# Patient Record
Sex: Female | Born: 1960 | Race: Black or African American | Hispanic: No | Smoking: Never smoker
Health system: Southern US, Community
[De-identification: ages and names within clinical notes are randomized; demographics above are authoritative.]

---

## 2002-01-30 ENCOUNTER — Emergency Department (HOSPITAL_COMMUNITY): Admission: EM | Admit: 2002-01-30 | Discharge: 2002-01-30 | Payer: Self-pay | Admitting: Emergency Medicine

## 2004-04-28 ENCOUNTER — Emergency Department (HOSPITAL_COMMUNITY): Admission: EM | Admit: 2004-04-28 | Discharge: 2004-04-29 | Payer: Self-pay | Admitting: Emergency Medicine

## 2005-01-10 ENCOUNTER — Ambulatory Visit (HOSPITAL_COMMUNITY): Admission: RE | Admit: 2005-01-10 | Discharge: 2005-01-10 | Payer: Self-pay | Admitting: Internal Medicine

## 2005-02-03 ENCOUNTER — Observation Stay (HOSPITAL_COMMUNITY): Admission: AD | Admit: 2005-02-03 | Discharge: 2005-02-04 | Payer: Self-pay | Admitting: Neurosurgery

## 2006-03-22 ENCOUNTER — Emergency Department (HOSPITAL_COMMUNITY): Admission: EM | Admit: 2006-03-22 | Discharge: 2006-03-23 | Payer: Self-pay | Admitting: Emergency Medicine

## 2007-07-17 ENCOUNTER — Ambulatory Visit (HOSPITAL_COMMUNITY): Admission: RE | Admit: 2007-07-17 | Discharge: 2007-07-17 | Payer: Self-pay | Admitting: Obstetrics

## 2008-04-22 ENCOUNTER — Emergency Department (HOSPITAL_COMMUNITY): Admission: EM | Admit: 2008-04-22 | Discharge: 2008-04-22 | Payer: Self-pay | Admitting: Emergency Medicine

## 2008-08-07 ENCOUNTER — Ambulatory Visit (HOSPITAL_COMMUNITY): Admission: RE | Admit: 2008-08-07 | Discharge: 2008-08-07 | Payer: Self-pay | Admitting: Internal Medicine

## 2009-01-14 ENCOUNTER — Emergency Department (HOSPITAL_COMMUNITY): Admission: EM | Admit: 2009-01-14 | Discharge: 2009-01-14 | Payer: Self-pay | Admitting: Emergency Medicine

## 2009-05-17 ENCOUNTER — Emergency Department (HOSPITAL_COMMUNITY): Admission: EM | Admit: 2009-05-17 | Discharge: 2009-05-17 | Payer: Self-pay | Admitting: Emergency Medicine

## 2009-05-26 ENCOUNTER — Ambulatory Visit: Payer: Self-pay | Admitting: Internal Medicine

## 2009-05-26 DIAGNOSIS — I10 Essential (primary) hypertension: Secondary | ICD-10-CM | POA: Insufficient documentation

## 2009-05-26 DIAGNOSIS — K921 Melena: Secondary | ICD-10-CM | POA: Insufficient documentation

## 2009-05-26 DIAGNOSIS — E785 Hyperlipidemia, unspecified: Secondary | ICD-10-CM

## 2009-08-10 ENCOUNTER — Ambulatory Visit (HOSPITAL_COMMUNITY): Admission: RE | Admit: 2009-08-10 | Discharge: 2009-08-10 | Payer: Self-pay | Admitting: Obstetrics

## 2009-08-19 ENCOUNTER — Encounter: Admission: RE | Admit: 2009-08-19 | Discharge: 2009-08-19 | Payer: Self-pay | Admitting: Obstetrics

## 2009-12-01 ENCOUNTER — Emergency Department (HOSPITAL_COMMUNITY): Admission: EM | Admit: 2009-12-01 | Discharge: 2009-12-01 | Payer: Self-pay | Admitting: Emergency Medicine

## 2009-12-21 ENCOUNTER — Emergency Department (HOSPITAL_COMMUNITY): Admission: EM | Admit: 2009-12-21 | Discharge: 2009-12-21 | Payer: Self-pay | Admitting: Emergency Medicine

## 2010-03-24 ENCOUNTER — Emergency Department (HOSPITAL_COMMUNITY): Admission: EM | Admit: 2010-03-24 | Discharge: 2010-03-24 | Payer: Self-pay | Admitting: Emergency Medicine

## 2010-09-17 ENCOUNTER — Encounter: Admission: RE | Admit: 2010-09-17 | Discharge: 2010-09-17 | Payer: Self-pay | Admitting: Family Medicine

## 2010-12-05 LAB — CONVERTED CEMR LAB
Albumin: 3.9 g/dL (ref 3.5–5.2)
Basophils Relative: 0.9 % (ref 0.0–3.0)
CO2: 31 meq/L (ref 19–32)
Chloride: 108 meq/L (ref 96–112)
Eosinophils Absolute: 0.2 10*3/uL (ref 0.0–0.7)
HCT: 38.2 % (ref 36.0–46.0)
HDL: 40.4 mg/dL (ref >39.00)
Hemoglobin: 12.7 g/dL (ref 12.0–15.0)
Ketones, ur: NEGATIVE mg/dL
Leukocytes, UA: NEGATIVE
MCHC: 33.1 g/dL (ref 30.0–36.0)
MCV: 78.4 fL (ref 78.0–100.0)
Monocytes Absolute: 0.3 10*3/uL (ref 0.1–1.0)
Neutro Abs: 2.8 10*3/uL (ref 1.4–7.7)
Pap Smear: NORMAL
RBC: 4.87 M/uL (ref 3.87–5.11)
Sodium: 143 meq/L (ref 135–145)
Specific Gravity, Urine: 1.025 (ref 1.000–1.030)
TSH: 0.74 microintl units/mL (ref 0.35–5.50)
Total Protein: 7.1 g/dL (ref 6.0–8.3)
Triglycerides: 86 mg/dL (ref 0.0–149.0)
pH: 6.5 (ref 5.0–8.0)

## 2010-12-08 ENCOUNTER — Emergency Department (HOSPITAL_COMMUNITY)
Admission: EM | Admit: 2010-12-08 | Discharge: 2010-12-08 | Disposition: A | Discharge status: Other Acute Inpatient Hospital | Payer: 59 | Attending: Emergency Medicine | Admitting: Emergency Medicine

## 2010-12-08 DIAGNOSIS — F329 Major depressive disorder, single episode, unspecified: Secondary | ICD-10-CM | POA: Insufficient documentation

## 2010-12-08 DIAGNOSIS — F3289 Other specified depressive episodes: Secondary | ICD-10-CM | POA: Insufficient documentation

## 2010-12-08 DIAGNOSIS — R45851 Suicidal ideations: Secondary | ICD-10-CM | POA: Insufficient documentation

## 2010-12-08 LAB — DIFFERENTIAL
Basophils Absolute: 0 10*3/uL (ref 0.0–0.1)
Lymphocytes Relative: 37 % (ref 12–46)
Monocytes Absolute: 0.4 10*3/uL (ref 0.1–1.0)
Neutro Abs: 3.1 10*3/uL (ref 1.7–7.7)
Neutrophils Relative %: 52 % (ref 43–77)

## 2010-12-08 LAB — COMPREHENSIVE METABOLIC PANEL
Albumin: 4 g/dL (ref 3.5–5.2)
BUN: 9 mg/dL (ref 6–23)
Calcium: 9.3 mg/dL (ref 8.4–10.5)
Creatinine, Ser: 0.82 mg/dL (ref 0.4–1.2)
Glucose, Bld: 93 mg/dL (ref 70–99)
Total Protein: 7.6 g/dL (ref 6.0–8.3)

## 2010-12-08 LAB — CBC
HCT: 40.6 % (ref 36.0–46.0)
Hemoglobin: 13.4 g/dL (ref 12.0–15.0)
MCHC: 33 g/dL (ref 30.0–36.0)
RBC: 5.14 MIL/uL — ABNORMAL HIGH (ref 3.87–5.11)
WBC: 5.9 10*3/uL (ref 4.0–10.5)

## 2010-12-08 LAB — RAPID URINE DRUG SCREEN, HOSP PERFORMED
Amphetamines: NOT DETECTED
Benzodiazepines: NOT DETECTED
Cocaine: NOT DETECTED
Opiates: NOT DETECTED
Tetrahydrocannabinol: POSITIVE — AB

## 2010-12-08 LAB — ETHANOL: Alcohol, Ethyl (B): <5 mg/dL (ref 0–10)

## 2010-12-15 IMAGING — CR DG LUMBAR SPINE COMPLETE 4+V
5 series · 5 of 5 positions shown · non-contrast
Comparison: MRI lumbar spine 01/10/2005.  Intraoperative
radiographs 02/03/2005.

CLINICAL DATA: 48-year-old female with low back pain for 1 week.
History of prior back surgery.

LUMBAR SPINE - COMPLETE 4+ VIEW

[t l-spine a.p.]
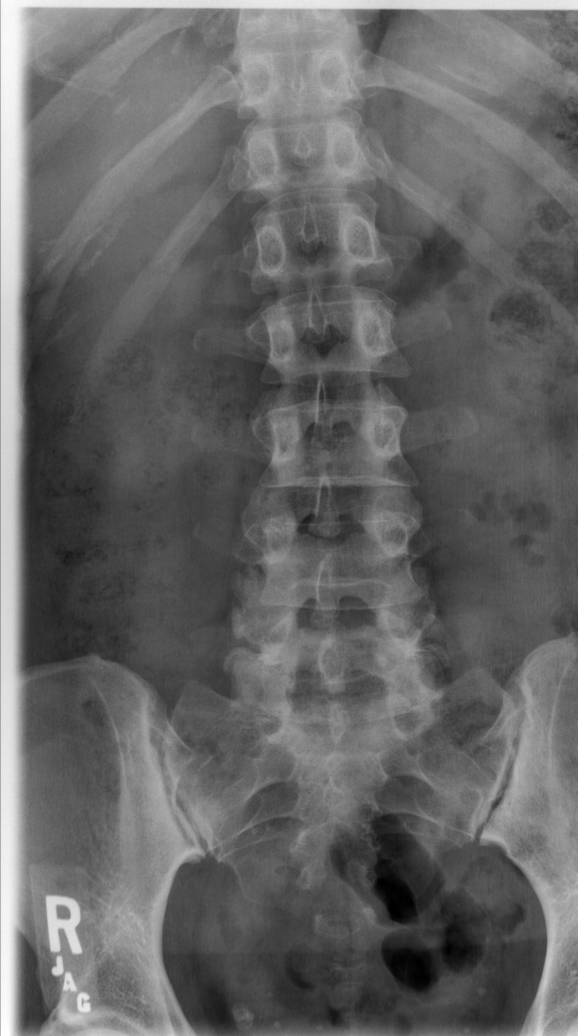

[t l-spine oblique exposure (1 of 2)]
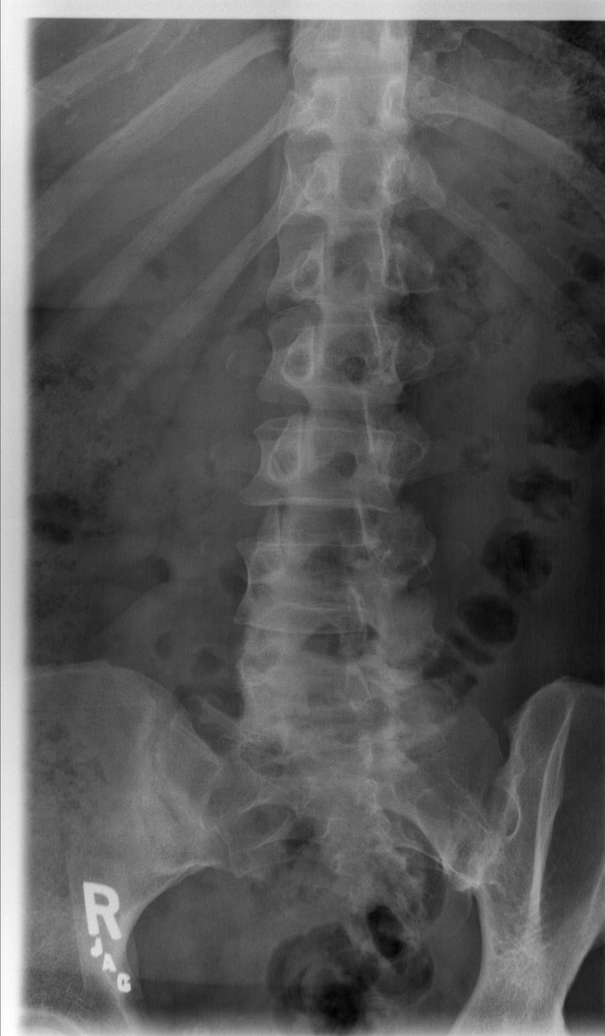

[t l-spine oblique exposure (2 of 2)]
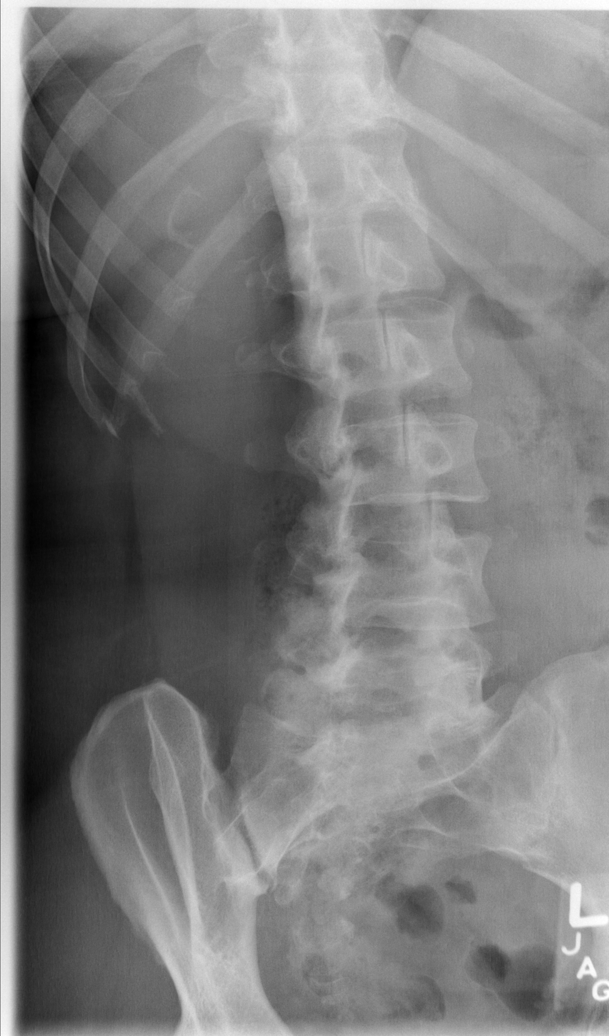

[t l-spine lat]
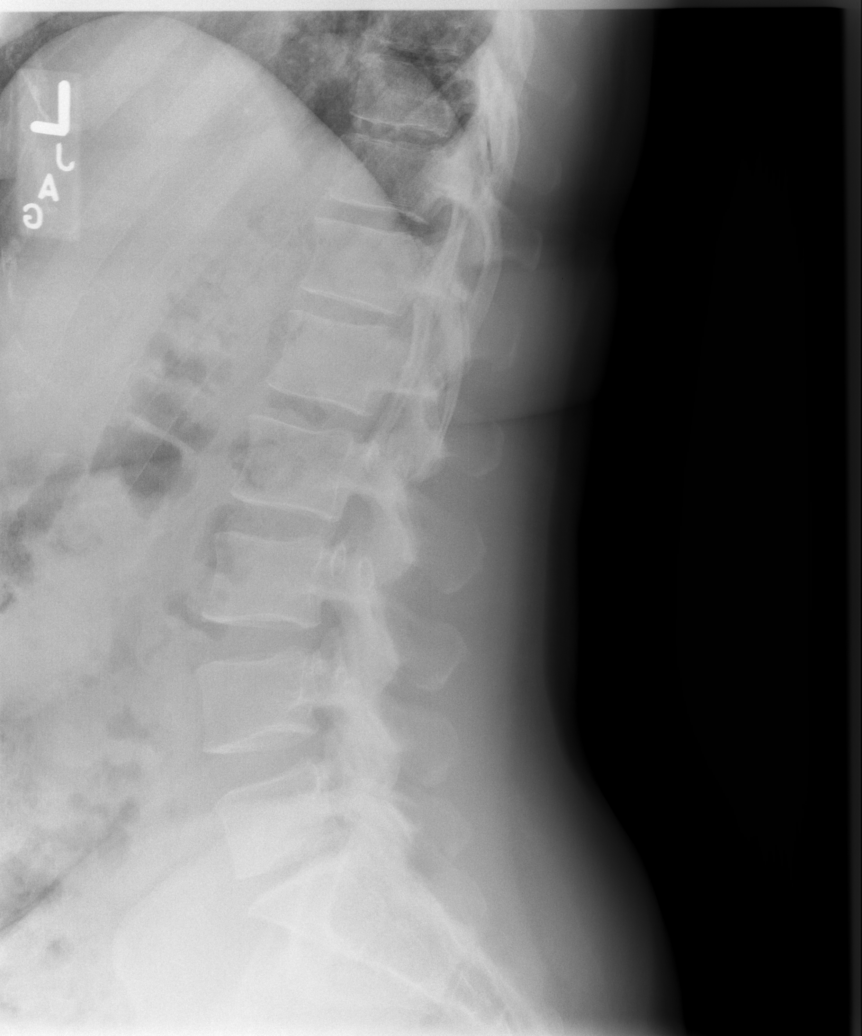

[t l-spine l5-s1 spot]
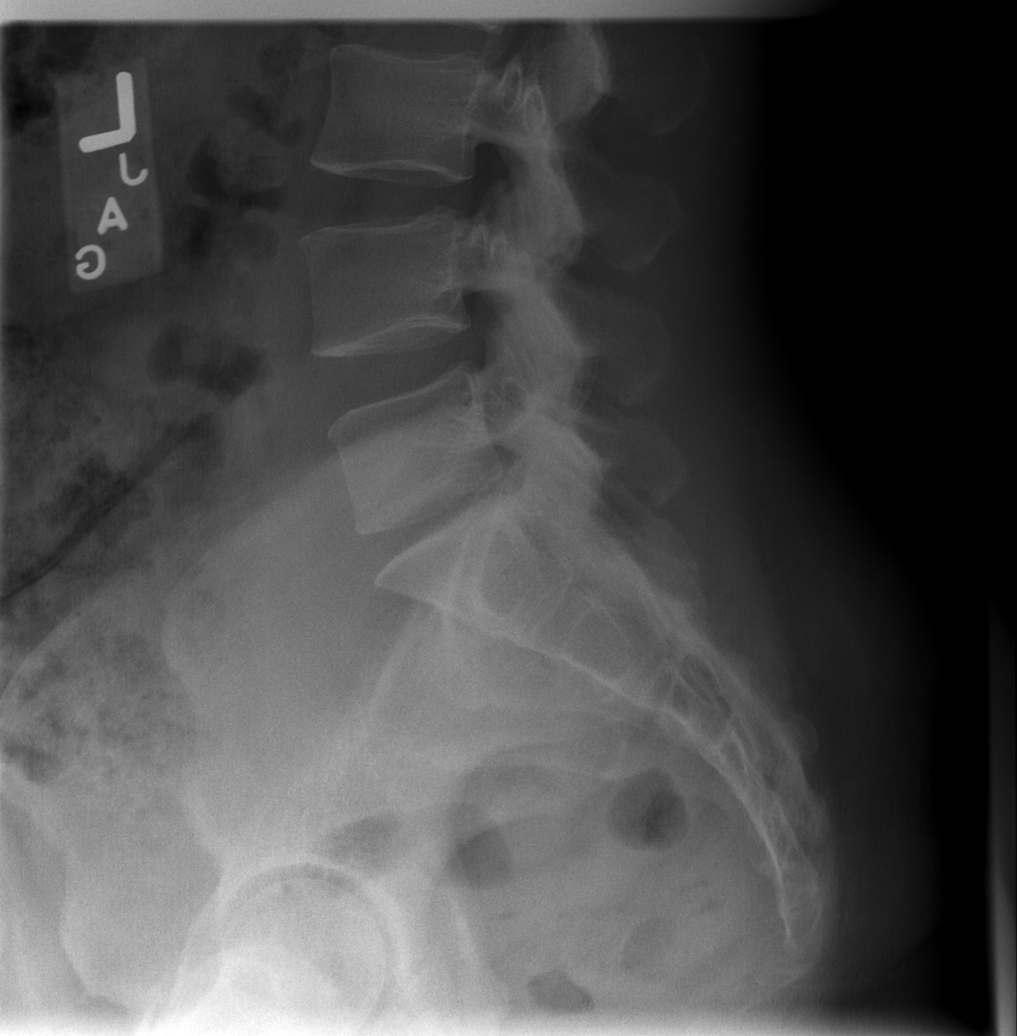

[5 of 5 positions shown; findings below may reference images not displayed]

FINDINGS: Normal lumbar segmentation.  Disc space narrowing L5-S1.
Otherwise preserved intervertebral disc heights.  Normal vertebral
body height and alignment.  There is moderate L4-L5 facet
hypertrophy bilaterally.  No pars fracture.  Sacrum and SI joints
appear within normal limits.
IMPRESSION: 1. No acute osseous abnormality identified in the lumbar spine.
2.  Lower lumbar disc and facet degeneration.

## 2010-12-29 ENCOUNTER — Ambulatory Visit (HOSPITAL_COMMUNITY)
Admission: RE | Admit: 2010-12-29 | Discharge: 2010-12-29 | Disposition: A | Discharge status: Home / Self Care | Payer: 59 | Attending: Psychiatry | Admitting: Psychiatry

## 2010-12-29 DIAGNOSIS — F332 Major depressive disorder, recurrent severe without psychotic features: Secondary | ICD-10-CM | POA: Insufficient documentation

## 2010-12-30 ENCOUNTER — Other Ambulatory Visit (HOSPITAL_COMMUNITY): Payer: 59 | Attending: Psychiatry | Admitting: Psychiatry

## 2010-12-30 DIAGNOSIS — F332 Major depressive disorder, recurrent severe without psychotic features: Secondary | ICD-10-CM | POA: Insufficient documentation

## 2010-12-31 ENCOUNTER — Other Ambulatory Visit (HOSPITAL_BASED_OUTPATIENT_CLINIC_OR_DEPARTMENT_OTHER): Payer: 59 | Admitting: Psychiatry

## 2010-12-31 DIAGNOSIS — F329 Major depressive disorder, single episode, unspecified: Secondary | ICD-10-CM

## 2010-12-31 DIAGNOSIS — F411 Generalized anxiety disorder: Secondary | ICD-10-CM

## 2011-01-03 ENCOUNTER — Other Ambulatory Visit (HOSPITAL_COMMUNITY): Payer: 59 | Admitting: Psychiatry

## 2011-01-04 ENCOUNTER — Other Ambulatory Visit (HOSPITAL_COMMUNITY): Payer: 59 | Admitting: Psychiatry

## 2011-01-05 ENCOUNTER — Other Ambulatory Visit (HOSPITAL_COMMUNITY): Payer: 59 | Admitting: Psychiatry

## 2011-01-06 ENCOUNTER — Other Ambulatory Visit (HOSPITAL_COMMUNITY): Payer: 59 | Attending: Psychiatry | Admitting: Psychiatry

## 2011-01-06 DIAGNOSIS — Z818 Family history of other mental and behavioral disorders: Secondary | ICD-10-CM | POA: Insufficient documentation

## 2011-01-06 DIAGNOSIS — F411 Generalized anxiety disorder: Secondary | ICD-10-CM | POA: Insufficient documentation

## 2011-01-06 DIAGNOSIS — F3289 Other specified depressive episodes: Secondary | ICD-10-CM | POA: Insufficient documentation

## 2011-01-06 DIAGNOSIS — F329 Major depressive disorder, single episode, unspecified: Secondary | ICD-10-CM

## 2011-01-07 ENCOUNTER — Other Ambulatory Visit (HOSPITAL_COMMUNITY): Payer: 59 | Admitting: Psychiatry

## 2011-01-10 ENCOUNTER — Other Ambulatory Visit (HOSPITAL_COMMUNITY): Payer: 59 | Admitting: Psychiatry

## 2011-01-11 ENCOUNTER — Other Ambulatory Visit (HOSPITAL_COMMUNITY): Payer: 59 | Admitting: Psychiatry

## 2011-01-12 ENCOUNTER — Other Ambulatory Visit (HOSPITAL_COMMUNITY): Payer: 59 | Admitting: Psychiatry

## 2011-01-13 ENCOUNTER — Other Ambulatory Visit (HOSPITAL_COMMUNITY): Payer: 59 | Admitting: Psychiatry

## 2011-01-14 ENCOUNTER — Other Ambulatory Visit (HOSPITAL_COMMUNITY): Payer: 59 | Admitting: Psychiatry

## 2011-01-17 ENCOUNTER — Other Ambulatory Visit (HOSPITAL_COMMUNITY): Payer: 59 | Admitting: Psychiatry

## 2011-01-18 ENCOUNTER — Other Ambulatory Visit (HOSPITAL_COMMUNITY): Payer: 59 | Admitting: Psychiatry

## 2011-01-19 ENCOUNTER — Other Ambulatory Visit (HOSPITAL_COMMUNITY): Payer: 59 | Admitting: Psychiatry

## 2011-01-23 LAB — PREGNANCY, URINE: Preg Test, Ur: NEGATIVE

## 2011-01-23 LAB — URINALYSIS, ROUTINE W REFLEX MICROSCOPIC
Glucose, UA: NEGATIVE mg/dL
Nitrite: NEGATIVE
pH: 6 (ref 5.0–8.0)

## 2011-01-27 LAB — URINALYSIS, ROUTINE W REFLEX MICROSCOPIC
Bilirubin Urine: NEGATIVE
Hgb urine dipstick: NEGATIVE
Nitrite: NEGATIVE
Protein, ur: NEGATIVE mg/dL
Specific Gravity, Urine: 1.02 (ref 1.005–1.030)
Urobilinogen, UA: 0.2 mg/dL (ref 0.0–1.0)

## 2011-03-25 NOTE — Op Note (Signed)
Kristie Cisneros, Kristie Cisneros            ACCOUNT NO.:  [id number]   MEDICAL RECORD NO.:  [id number]          PATIENT TYPE:  OIB   LOCATION:  3172                         FACILITY:  [facility]   PHYSICIAN:  Hewitt Shorts, M.D.DATE OF BIRTH:  February 21, 1961   DATE OF PROCEDURE:  02/03/2005  DATE OF DISCHARGE:                                 OPERATIVE REPORT   PREOPERATIVE DIAGNOSIS:  Right L5-S1 lumbar disc herniation, lumbar  degenerative disc disease, lumbar spondylosis, and lumbar radiculopathy.   POSTOPERATIVE DIAGNOSIS:  Right L5-S1 lumbar disc herniation, lumbar  degenerative disc disease, lumbar spondylosis, and lumbar radiculopathy.   PROCEDURE:  Right L5-S1 lumbar laminotomy and microdiscectomy with  microdissection.   SURGEON:  Hewitt Shorts, M.D.   ASSISTANT:  Stefani Dama, M.D.   ANESTHESIA:  General endotracheal anesthesia.   INDICATIONS FOR PROCEDURE:  The patient is a 50 year old woman who presented  with an acute right lumbar radiculopathy was found by MRI scan to have a  very large right L5-S1 disc herniation with a large fragment that had  migrated caudally behind the body of S1.  There is underlying degenerative  disc disease and spondylosis.  The decision was made to proceed with  elective laminotomy and microdiscectomy.   PROCEDURE:  The patient was brought to the operating room and placed under  general endotracheal anesthesia.  The patient was turned to the prone  position and the lumbar region was prepped with Betadine soaping solution  and draped in a sterile fashion.  The midline was infiltrated with local  anesthetic with epinephrine.  An x-ray was taken, the L5-S1 level was  identified.  A midline incision was made over the L5-S1 level and carried  down through the subcutaneous tissue.  Bipolar cautery and electrocautery  was used to maintain hemostasis.  Dissection was carried down to the lumbar  fascia which was incised on the right side of the  midline and the paraspinal  muscles were dissected from the spinous process and lamina in a  subperiosteal fashion.  The L5-S1 interlaminar space was identified.  An x-  ray was taken to confirm the localization and then, using the X-Max drill  and Kerrison punches, a laminotomy was performed.  The ligamentum flavum was  then carefully removed.  The microscope was draped and brought onto the  field to provide additional magnification, illumination, and visualization  and the remainder of the decompression was performed using microdissection  and microsurgical technique.  We identified the thecal sac and exiting S1  nerve root.  It was difficult to find the free fragment.  We found the  subligamentous disc herniation at the level of the disc space.  The annulus  was incised and the discectomy performed using as variety of pituitary  rongeurs and in the end, all loose fragments of disc material were removed  from the disc space.  However, we then carefully dissected in the epidural  tissues dorsal to the sacral vertebral body and were able to gradually  mobilize the free fragment which was, in fact, very large but stuck down to  the dural and epidural tissues.  In the end, all loose fragments of disc  material were removed and good decompression of the thecal sac and nerve  roots was achieved.  Once the discectomy was completed and hemostasis  established with the use of bipolar cautery and Gelfoam soaked in thrombin,  all the Gelfoam was removed and we then instilled 2 mL of Fentanyl and 80 mg  Depo-Medrol and then proceeded with closure.  The deep fascia was closed  with interrupted undyed #1 Vicryl suture, the subcutaneous and subcuticular  layer was closed with interrupted inverted 2-0 Vicryl sutures, and the skin  was reapproximated with  Dermabond.  The patient tolerated the procedure well.  Estimated blood loss  was 100 mL.  Sponge counts were correct.  Following surgery, the patient  was  turned back to the supine position, reversed from the anesthetic, extubated,  and transferred to the recovery room for further care.      RWN/MEDQ  D:  02/03/2005  T:  02/03/2005  Job:  [id number]

## 2011-08-04 LAB — DIFFERENTIAL
Basophils Absolute: 0.1
Basophils Relative: 1
Eosinophils Absolute: 0.2
Eosinophils Relative: 3
Lymphocytes Relative: 26
Lymphs Abs: 1.9
Monocytes Absolute: 0.5
Monocytes Relative: 8
Neutro Abs: 4.5
Neutrophils Relative %: 62

## 2011-08-04 LAB — POCT I-STAT, CHEM 8
BUN: 8
Calcium, Ion: 1.2
Chloride: 103
Creatinine, Ser: 1
Glucose, Bld: 107 — ABNORMAL HIGH
HCT: 43
Hemoglobin: 14.6
Potassium: 4.2
Sodium: 137
TCO2: 25

## 2011-08-04 LAB — CBC
HCT: 39.3
Hemoglobin: 13.6
MCHC: 34.4
MCV: 77.6 — ABNORMAL LOW
Platelets: 357
RBC: 5.07
RDW: 14.9
WBC: 7.2

## 2011-08-04 LAB — D-DIMER, QUANTITATIVE: D-Dimer, Quant: 0.3

## 2011-08-04 LAB — POCT CARDIAC MARKERS
CKMB, poc: <1 — ABNORMAL LOW
Myoglobin, poc: 34.1
Operator id: 272551
Troponin i, poc: <0.05

## 2011-09-21 ENCOUNTER — Other Ambulatory Visit (HOSPITAL_COMMUNITY): Payer: Self-pay | Admitting: Obstetrics and Gynecology

## 2011-09-21 DIAGNOSIS — Z1231 Encounter for screening mammogram for malignant neoplasm of breast: Secondary | ICD-10-CM

## 2011-10-20 ENCOUNTER — Ambulatory Visit (HOSPITAL_COMMUNITY): Payer: 59 | Attending: Obstetrics and Gynecology

## 2011-11-09 ENCOUNTER — Other Ambulatory Visit (HOSPITAL_COMMUNITY): Payer: Self-pay | Admitting: Family Medicine

## 2011-11-09 DIAGNOSIS — Z1231 Encounter for screening mammogram for malignant neoplasm of breast: Secondary | ICD-10-CM

## 2011-12-06 ENCOUNTER — Ambulatory Visit (HOSPITAL_COMMUNITY): Payer: 59 | Attending: Family Medicine

## 2011-12-19 ENCOUNTER — Other Ambulatory Visit (HOSPITAL_COMMUNITY): Payer: Self-pay | Admitting: Obstetrics and Gynecology

## 2011-12-19 DIAGNOSIS — Z1231 Encounter for screening mammogram for malignant neoplasm of breast: Secondary | ICD-10-CM

## 2012-01-12 ENCOUNTER — Ambulatory Visit (HOSPITAL_COMMUNITY): Payer: 59 | Attending: Obstetrics and Gynecology

## 2014-12-29 ENCOUNTER — Other Ambulatory Visit (HOSPITAL_COMMUNITY): Payer: Self-pay | Admitting: Family Medicine

## 2014-12-29 DIAGNOSIS — Z1231 Encounter for screening mammogram for malignant neoplasm of breast: Secondary | ICD-10-CM

## 2015-01-29 ENCOUNTER — Ambulatory Visit (HOSPITAL_COMMUNITY)
Admission: RE | Admit: 2015-01-29 | Discharge: 2015-01-29 | Disposition: A | Discharge status: Home / Self Care | Payer: 59 | Source: Ambulatory Visit | Attending: Family Medicine | Admitting: Family Medicine

## 2015-01-29 ENCOUNTER — Other Ambulatory Visit (HOSPITAL_COMMUNITY): Payer: Self-pay | Admitting: Family Medicine

## 2015-01-29 DIAGNOSIS — Z1231 Encounter for screening mammogram for malignant neoplasm of breast: Secondary | ICD-10-CM | POA: Diagnosis present

## 2016-10-17 ENCOUNTER — Other Ambulatory Visit: Payer: Self-pay | Admitting: Family Medicine

## 2016-10-17 DIAGNOSIS — Z1231 Encounter for screening mammogram for malignant neoplasm of breast: Secondary | ICD-10-CM

## 2016-11-21 ENCOUNTER — Ambulatory Visit
Admission: RE | Admit: 2016-11-21 | Discharge: 2016-11-21 | Disposition: A | Discharge status: Home / Self Care | Payer: 59 | Source: Ambulatory Visit | Attending: Family Medicine | Admitting: Family Medicine

## 2016-11-21 ENCOUNTER — Other Ambulatory Visit: Payer: Self-pay | Admitting: Family Medicine

## 2016-11-21 DIAGNOSIS — Z1231 Encounter for screening mammogram for malignant neoplasm of breast: Secondary | ICD-10-CM

## 2017-08-14 ENCOUNTER — Other Ambulatory Visit: Payer: Self-pay | Admitting: Gastroenterology

## 2017-08-14 DIAGNOSIS — R1906 Epigastric swelling, mass or lump: Secondary | ICD-10-CM

## 2017-08-25 ENCOUNTER — Other Ambulatory Visit: Payer: 59

## 2017-09-08 ENCOUNTER — Other Ambulatory Visit: Payer: Self-pay | Admitting: Gastroenterology

## 2017-09-08 ENCOUNTER — Ambulatory Visit
Admission: RE | Admit: 2017-09-08 | Discharge: 2017-09-08 | Disposition: A | Discharge status: Home / Self Care | Payer: 59 | Source: Ambulatory Visit | Attending: Gastroenterology | Admitting: Gastroenterology

## 2017-09-08 DIAGNOSIS — R1906 Epigastric swelling, mass or lump: Secondary | ICD-10-CM

## 2017-09-08 NOTE — Progress Notes (Signed)
Grabiela Wohlford MD 

## 2017-09-12 ENCOUNTER — Other Ambulatory Visit: Payer: Self-pay | Admitting: Gastroenterology

## 2017-09-12 DIAGNOSIS — R1900 Intra-abdominal and pelvic swelling, mass and lump, unspecified site: Secondary | ICD-10-CM

## 2017-10-13 ENCOUNTER — Inpatient Hospital Stay: Admission: RE | Admit: 2017-10-13 | Payer: 59 | Source: Ambulatory Visit

## 2017-11-23 ENCOUNTER — Ambulatory Visit
Admission: RE | Admit: 2017-11-23 | Discharge: 2017-11-23 | Disposition: A | Discharge status: Home / Self Care | Payer: 59 | Source: Ambulatory Visit | Attending: Gastroenterology | Admitting: Gastroenterology

## 2017-11-23 DIAGNOSIS — R1900 Intra-abdominal and pelvic swelling, mass and lump, unspecified site: Secondary | ICD-10-CM

## 2017-12-08 ENCOUNTER — Other Ambulatory Visit: Payer: Self-pay | Admitting: Gastroenterology

## 2017-12-08 DIAGNOSIS — R1900 Intra-abdominal and pelvic swelling, mass and lump, unspecified site: Secondary | ICD-10-CM

## 2018-01-03 ENCOUNTER — Other Ambulatory Visit: Payer: Self-pay | Admitting: Family Medicine

## 2018-01-03 DIAGNOSIS — Z1231 Encounter for screening mammogram for malignant neoplasm of breast: Secondary | ICD-10-CM

## 2018-01-12 ENCOUNTER — Other Ambulatory Visit: Payer: 59

## 2018-01-26 ENCOUNTER — Ambulatory Visit
Admission: RE | Admit: 2018-01-26 | Discharge: 2018-01-26 | Disposition: A | Discharge status: Home / Self Care | Payer: Managed Care, Other (non HMO) | Source: Ambulatory Visit | Attending: Gastroenterology | Admitting: Gastroenterology

## 2018-01-26 ENCOUNTER — Ambulatory Visit: Payer: 59

## 2018-01-26 DIAGNOSIS — R1900 Intra-abdominal and pelvic swelling, mass and lump, unspecified site: Secondary | ICD-10-CM

## 2018-01-26 MED ORDER — GADOBENATE DIMEGLUMINE 529 MG/ML IV SOLN
20.0000 mL | Freq: Once | INTRAVENOUS | Status: AC | PRN
  Administered 2018-01-26: 20 mL via INTRAVENOUS

## 2018-02-02 ENCOUNTER — Other Ambulatory Visit: Payer: Self-pay

## 2018-02-23 ENCOUNTER — Ambulatory Visit: Payer: Managed Care, Other (non HMO)

## 2018-04-01 ENCOUNTER — Emergency Department (HOSPITAL_COMMUNITY)
Admission: EM | Admit: 2018-04-01 | Discharge: 2018-04-01 | Disposition: A | Discharge status: Home / Self Care | Payer: Managed Care, Other (non HMO) | Attending: Physician Assistant | Admitting: Physician Assistant

## 2018-04-01 ENCOUNTER — Other Ambulatory Visit: Payer: Self-pay

## 2018-04-01 ENCOUNTER — Encounter (HOSPITAL_COMMUNITY): Payer: Self-pay | Admitting: Emergency Medicine

## 2018-04-01 ENCOUNTER — Emergency Department (HOSPITAL_COMMUNITY): Payer: Managed Care, Other (non HMO)

## 2018-04-01 DIAGNOSIS — J3489 Other specified disorders of nose and nasal sinuses: Secondary | ICD-10-CM | POA: Diagnosis not present

## 2018-04-01 DIAGNOSIS — B9789 Other viral agents as the cause of diseases classified elsewhere: Secondary | ICD-10-CM | POA: Diagnosis not present

## 2018-04-01 DIAGNOSIS — J069 Acute upper respiratory infection, unspecified: Secondary | ICD-10-CM | POA: Diagnosis not present

## 2018-04-01 DIAGNOSIS — R05 Cough: Secondary | ICD-10-CM | POA: Diagnosis present

## 2018-04-01 DIAGNOSIS — R109 Unspecified abdominal pain: Secondary | ICD-10-CM | POA: Diagnosis not present

## 2018-04-01 LAB — URINALYSIS, ROUTINE W REFLEX MICROSCOPIC
Bilirubin Urine: NEGATIVE
GLUCOSE, UA: NEGATIVE mg/dL
Hgb urine dipstick: NEGATIVE
KETONES UR: NEGATIVE mg/dL
Leukocytes, UA: NEGATIVE
NITRITE: NEGATIVE
PH: 7 (ref 5.0–8.0)
Protein, ur: NEGATIVE mg/dL
Specific Gravity, Urine: 1.019 (ref 1.005–1.030)

## 2018-04-01 MED ORDER — GUAIFENESIN-CODEINE 100-10 MG/5ML PO SOLN: 5.0000 mL | Freq: Three times a day (TID) | ORAL | 0 refills | Status: AC | PRN

## 2018-04-01 MED ORDER — BENZONATATE 100 MG PO CAPS: 200.0000 mg | ORAL_CAPSULE | Freq: Three times a day (TID) | ORAL | 0 refills | Status: AC

## 2018-04-01 MED ORDER — BENZONATATE 100 MG PO CAPS
200.0000 mg | ORAL_CAPSULE | Freq: Once | ORAL | Status: AC
  Administered 2018-04-01: 200 mg via ORAL
  Filled 2018-04-01: qty 2

## 2018-04-01 NOTE — ED Provider Notes (Signed)
[facility] EMERGENCY DEPARTMENT Provider Note   CSN: [id number] Arrival date & time: 04/01/18  1025     History   Chief Complaint Chief Complaint  Patient presents with  . Cough  . Flank Pain    HPI Kristie Cisneros is a 57 y.o. female with a past medical history of hypertension, hyperlipidemia, who presents to ED for evaluation of 3-week history of hacking cough.  States that the cough is not productive but she feels like she needs to "get stuff up."  She has tried Mucinex with only mild improvement in her symptoms.  She states that the cough is worse at night.  When symptoms first began, she began having sore throat and rhinorrhea but states that this has gradually resolved.  She denies any shortness of breath, wheezing, chest pain, hemoptysis, history of asthma, history of COPD, tobacco use. Note, patient denies any flank pain which is noted on chief complaint.  HPI  History reviewed. No pertinent past medical history.  Patient Active Problem List   Diagnosis Date Noted  . HYPERLIPIDEMIA 05/26/2009  . HYPERTENSION 05/26/2009  . HEMATOCHEZIA 05/26/2009    History reviewed. No pertinent surgical history.   OB History   None      Home Medications    Prior to Admission medications   Medication Sig Start Date End Date Taking? Authorizing Provider  benzonatate (TESSALON) 100 MG capsule Take 2 capsules (200 mg total) by mouth every 8 (eight) hours. 04/01/18   Kien Mirsky, PA-C  guaiFENesin-codeine 100-10 MG/5ML syrup Take 5 mLs by mouth 3 (three) times daily as needed for cough. 04/01/18   Dietrich Pates, PA-C    Family History No family history on file.  Social History Social History   Tobacco Use  . Smoking status: Never Smoker  . Smokeless tobacco: Never Used  Substance Use Topics  . Alcohol use: Yes  . Drug use: Not Currently     Allergies   Patient has no allergy information on record.   Review of Systems Review of Systems  HENT:  Positive for rhinorrhea. Negative for congestion, sore throat and trouble swallowing.   Respiratory: Positive for cough. Negative for choking, chest tightness, shortness of breath and wheezing.   Cardiovascular: Negative for chest pain.     Physical Exam Updated Vital Signs BP (!) 168/96 (BP Location: Right Arm)   Pulse 68   Temp 98.4 F (36.9 C) (Oral)   Resp 18   Ht  (1.727 m)   SpO2 98%   BMI 34.38 kg/m   Physical Exam  Constitutional: She appears well-developed and well-nourished. No distress.  Nontoxic-appearing and in no acute distress.  Hacking cough noted.  HENT:  Head: Normocephalic and atraumatic.  Right Ear: Tympanic membrane normal.  Left Ear: Tympanic membrane normal.  Nose: Rhinorrhea present.  Mouth/Throat: Uvula is midline and oropharynx is clear and moist.  Eyes: Conjunctivae and EOM are normal. No scleral icterus.  Neck: Normal range of motion.  Cardiovascular: Normal rate, regular rhythm and normal heart sounds.  Pulmonary/Chest: Effort normal and breath sounds normal. No respiratory distress.  Neurological: She is alert.  Skin: No rash noted. She is not diaphoretic.  Psychiatric: She has a normal mood and affect.  Nursing note and vitals reviewed.    ED Treatments / Results  Labs (all labs ordered are listed, but only abnormal results are displayed) Labs Reviewed  URINALYSIS, ROUTINE W REFLEX MICROSCOPIC    EKG None  Radiology Dg Chest 2 View  Result Date: 04/01/2018 CLINICAL DATA:  Cough for 3 weeks. EXAM: CHEST - 2 VIEW COMPARISON:  03/24/2010 FINDINGS: The heart size and mediastinal contours are within normal limits. Both lungs are clear. The visualized skeletal structures are unremarkable. IMPRESSION: No active cardiopulmonary disease. Electronically Signed   By: Signa Kell M.D.   On: 04/01/2018 13:44    Procedures Procedures (including critical care time)  Medications Ordered in ED Medications  benzonatate (TESSALON) capsule  200 mg (has no administration in time range)     Initial Impression / Assessment and Plan / ED Course  I have reviewed the triage vital signs and the nursing notes.  Pertinent labs & imaging results that were available during my care of the patient were reviewed by me and considered in my medical decision making (see chart for details).     Patient presents to ED for evaluation of 3-week history of cough.  States the cough is not productive but feels like she needs to "get stuff up."  No improvement with Mucinex.  Denies any shortness of breath, wheezing, chest pain, hemoptysis, history of asthma, COPD or tobacco use.  Physical exam she is overall well-appearing.  Cough is noted.  Lungs are clear to auscultation bilaterally.  She is not tachycardic, tachypneic or hypoxic.  She is afebrile.  Chest x-ray is unremarkable.  Suspect viral cause of her symptoms.  Doubt pneumonia, PE or other emergent cause of her symptoms.  Will give antitussives and advised her to follow-up with her primary care provider for further evaluation.  Advised to return to ED for any severe worsening symptoms.  narcotic database reviewed with no discrepancies.  Portions of this note were generated with Scientist, clinical (histocompatibility and immunogenetics). Dictation errors may occur despite best attempts at proofreading.   Final Clinical Impressions(s) / ED Diagnoses   Final diagnoses:  Viral URI with cough    ED Discharge Orders        Ordered    benzonatate (TESSALON) 100 MG capsule  Every 8 hours     04/01/18 1350    guaiFENesin-codeine 100-10 MG/5ML syrup  3 times daily PRN     04/01/18 1350       Dietrich Pates, PA-C 04/01/18 1355    [place], Cindee Salt, MD 04/06/18 0015

## 2018-04-01 NOTE — ED Triage Notes (Signed)
Pt. Stated, Kristie Cisneros had this cough for 3 weeks. And feels like no energy.

## 2018-04-12 ENCOUNTER — Ambulatory Visit
Admission: RE | Admit: 2018-04-12 | Discharge: 2018-04-12 | Disposition: A | Discharge status: Home / Self Care | Payer: Managed Care, Other (non HMO) | Source: Ambulatory Visit | Attending: Family Medicine | Admitting: Family Medicine

## 2018-04-12 DIAGNOSIS — Z1231 Encounter for screening mammogram for malignant neoplasm of breast: Secondary | ICD-10-CM

## 2018-05-23 ENCOUNTER — Ambulatory Visit: Payer: Self-pay | Admitting: General Surgery

## 2018-07-13 ENCOUNTER — Encounter: Payer: Self-pay | Admitting: *Deleted

## 2018-08-08 ENCOUNTER — Encounter: Payer: Self-pay | Admitting: Obstetrics & Gynecology

## 2018-08-08 ENCOUNTER — Encounter: Payer: Managed Care, Other (non HMO) | Admitting: Obstetrics & Gynecology

## 2019-04-15 IMAGING — DX DG CHEST 2V
2 series · 2 of 2 positions shown · non-contrast
Comparison: 03/24/2010

CLINICAL DATA: Cough for 3 weeks.

EXAM:
CHEST - 2 VIEW

[chest pa]
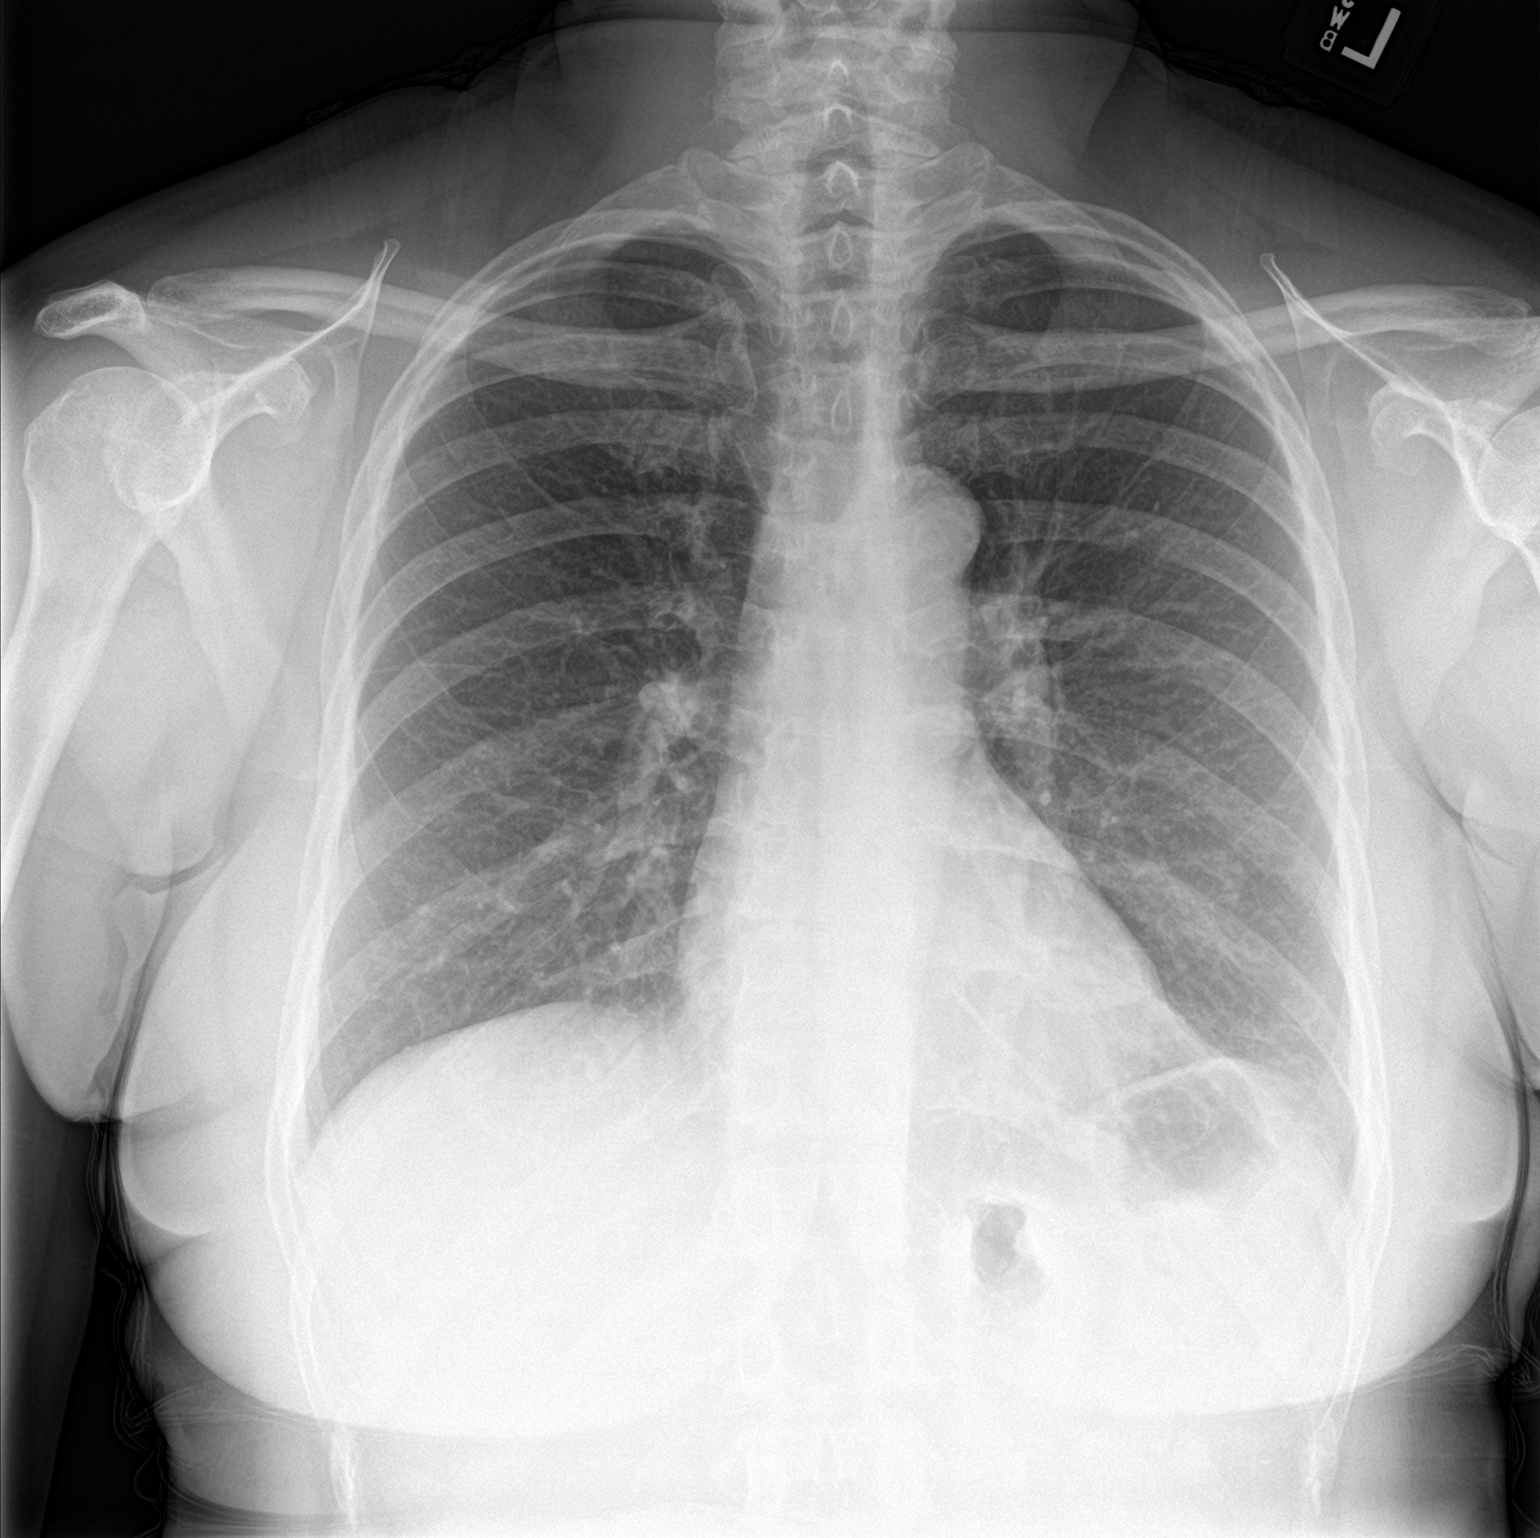

[chest lat]
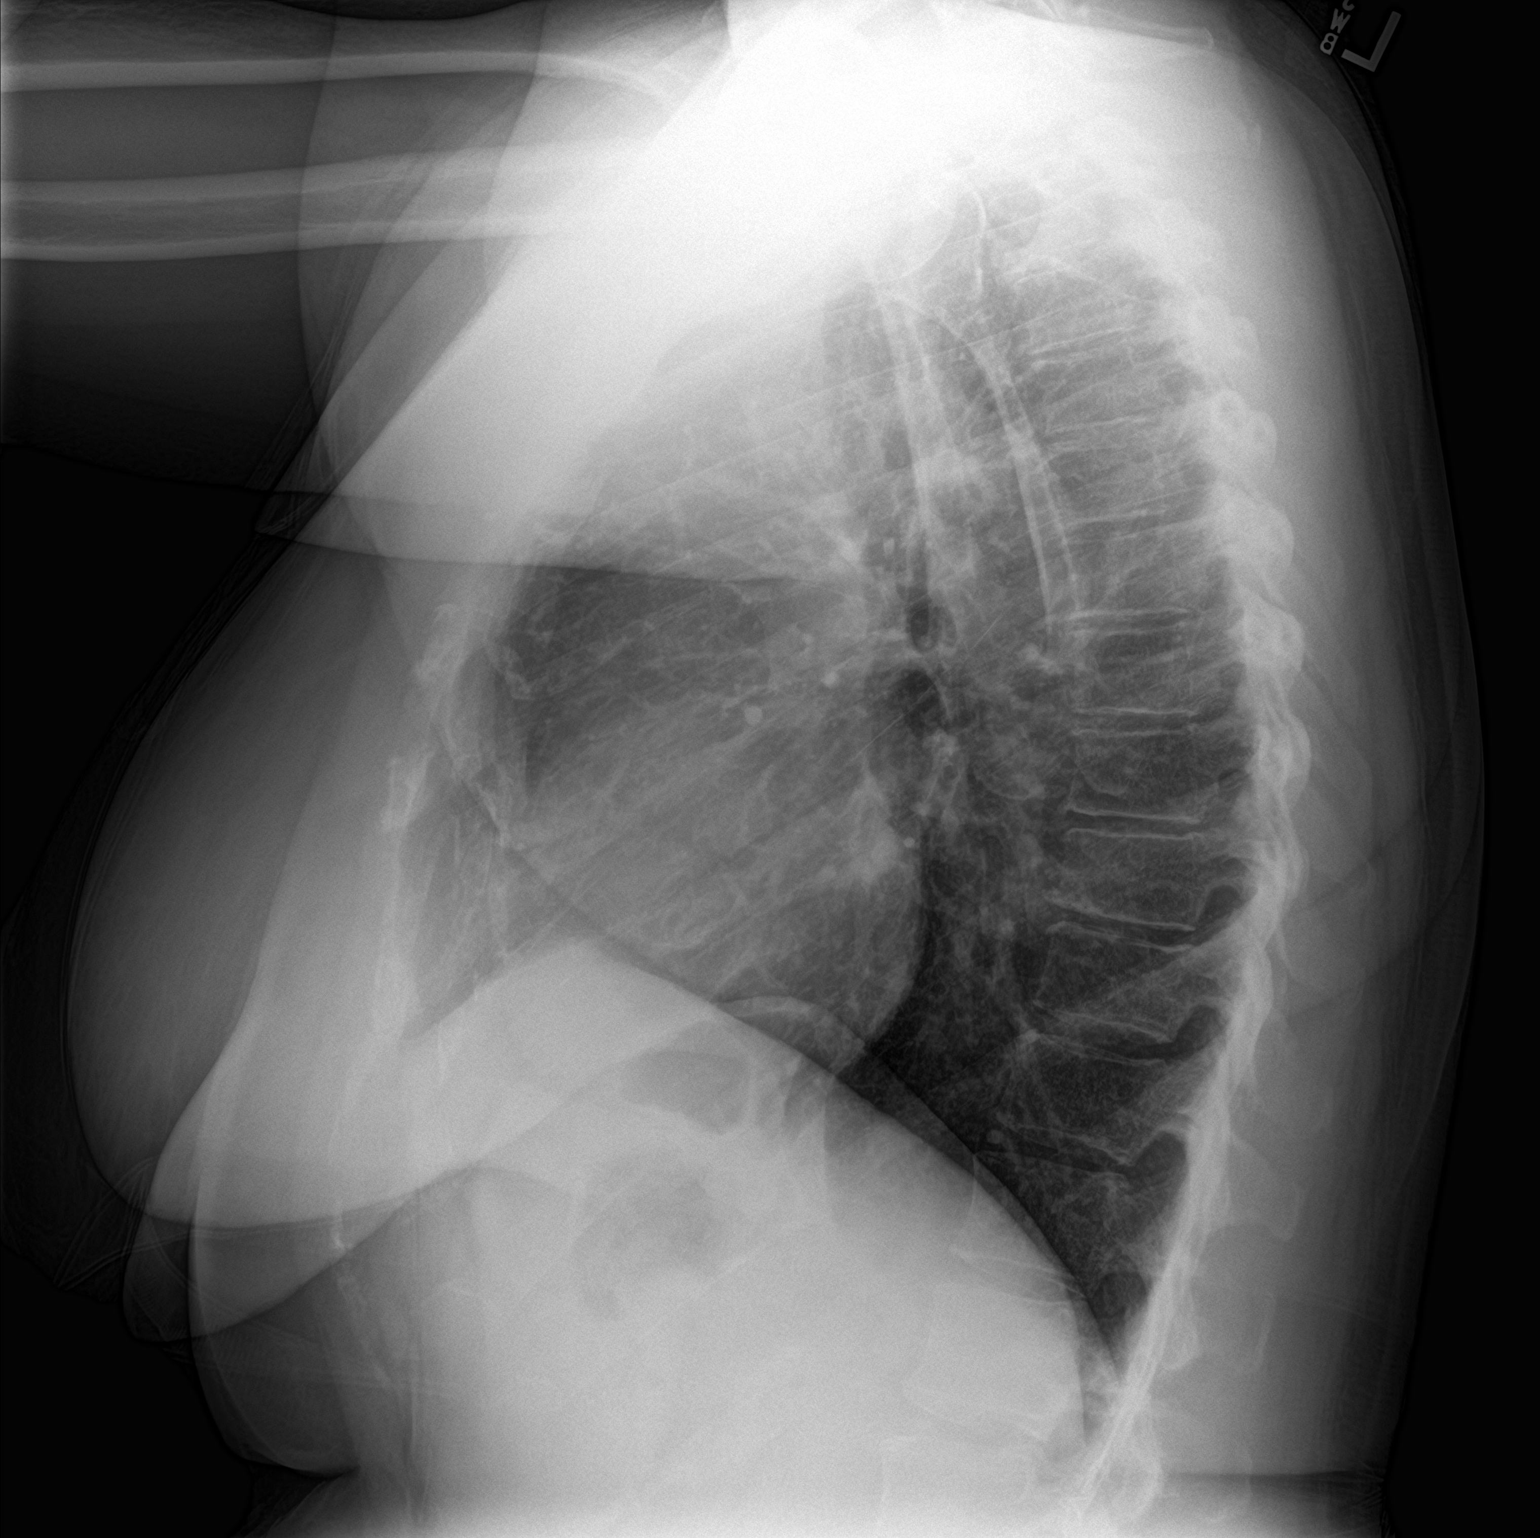

[2 of 2 positions shown; findings below may reference images not displayed]

FINDINGS: The heart size and mediastinal contours are within normal limits.
Both lungs are clear. The visualized skeletal structures are
unremarkable.
IMPRESSION: No active cardiopulmonary disease.

## 2019-07-17 ENCOUNTER — Other Ambulatory Visit: Payer: Self-pay | Admitting: Internal Medicine

## 2019-07-17 DIAGNOSIS — Z1231 Encounter for screening mammogram for malignant neoplasm of breast: Secondary | ICD-10-CM

## 2019-08-29 ENCOUNTER — Ambulatory Visit: Payer: Managed Care, Other (non HMO)
